# Patient Record
Sex: Female | Born: 1990 | Race: Black or African American | Hispanic: No | Marital: Single | Smoking: Current every day smoker
Health system: Southern US, Community
[De-identification: ages and names within clinical notes are randomized; demographics above are authoritative.]

## PROBLEM LIST (undated history)

## (undated) DIAGNOSIS — Z789 Other specified health status: Secondary | ICD-10-CM

---

## 2013-12-17 ENCOUNTER — Encounter (HOSPITAL_COMMUNITY): Payer: Self-pay | Admitting: Emergency Medicine

## 2013-12-17 ENCOUNTER — Emergency Department (HOSPITAL_COMMUNITY): Payer: Self-pay

## 2013-12-17 ENCOUNTER — Inpatient Hospital Stay (HOSPITAL_COMMUNITY)
Admission: EM | Admit: 2013-12-17 | Discharge: 2013-12-19 | DRG: 690 | Disposition: A | Discharge status: Home Health | Payer: Self-pay | Attending: Internal Medicine | Admitting: Internal Medicine

## 2013-12-17 DIAGNOSIS — F17200 Nicotine dependence, unspecified, uncomplicated: Secondary | ICD-10-CM | POA: Diagnosis present

## 2013-12-17 DIAGNOSIS — B9689 Other specified bacterial agents as the cause of diseases classified elsewhere: Secondary | ICD-10-CM | POA: Diagnosis present

## 2013-12-17 DIAGNOSIS — N760 Acute vaginitis: Secondary | ICD-10-CM | POA: Diagnosis present

## 2013-12-17 DIAGNOSIS — N12 Tubulo-interstitial nephritis, not specified as acute or chronic: Principal | ICD-10-CM | POA: Diagnosis present

## 2013-12-17 DIAGNOSIS — Z6841 Body Mass Index (BMI) 40.0 and over, adult: Secondary | ICD-10-CM

## 2013-12-17 DIAGNOSIS — A499 Bacterial infection, unspecified: Secondary | ICD-10-CM | POA: Diagnosis present

## 2013-12-17 HISTORY — DX: Other specified health status: Z78.9

## 2013-12-17 LAB — COMPREHENSIVE METABOLIC PANEL
ALBUMIN: 3.6 g/dL (ref 3.5–5.2)
ALT: 11 U/L (ref 0–35)
AST: 14 U/L (ref 0–37)
Alkaline Phosphatase: 90 U/L (ref 39–117)
Anion gap: 13 (ref 5–15)
BUN: 7 mg/dL (ref 6–23)
CALCIUM: 8.9 mg/dL (ref 8.4–10.5)
CO2: 22 mEq/L (ref 19–32)
Chloride: 102 mEq/L (ref 96–112)
Creatinine, Ser: 1.03 mg/dL (ref 0.50–1.10)
GFR calc non Af Amer: 77 mL/min — ABNORMAL LOW (ref >90)
GFR, EST AFRICAN AMERICAN: 89 mL/min — AB (ref >90)
Glucose, Bld: 98 mg/dL (ref 70–99)
Potassium: 3.8 mEq/L (ref 3.7–5.3)
SODIUM: 137 meq/L (ref 137–147)
TOTAL PROTEIN: 7.2 g/dL (ref 6.0–8.3)
Total Bilirubin: 0.7 mg/dL (ref 0.3–1.2)

## 2013-12-17 LAB — URINE MICROSCOPIC-ADD ON

## 2013-12-17 LAB — CBC WITH DIFFERENTIAL/PLATELET
Basophils Absolute: 0 10*3/uL (ref 0.0–0.1)
Basophils Relative: 0 % (ref 0–1)
EOS ABS: 0 10*3/uL (ref 0.0–0.7)
Eosinophils Relative: 0 % (ref 0–5)
HCT: 37.4 % (ref 36.0–46.0)
Hemoglobin: 12.3 g/dL (ref 12.0–15.0)
LYMPHS PCT: 10 % — AB (ref 12–46)
Lymphs Abs: 1.5 10*3/uL (ref 0.7–4.0)
MCH: 23 pg — ABNORMAL LOW (ref 26.0–34.0)
MCHC: 32.9 g/dL (ref 30.0–36.0)
MCV: 70 fL — ABNORMAL LOW (ref 78.0–100.0)
Monocytes Absolute: 1.6 10*3/uL — ABNORMAL HIGH (ref 0.1–1.0)
Monocytes Relative: 11 % (ref 3–12)
NEUTROS PCT: 79 % — AB (ref 43–77)
Neutro Abs: 11.7 10*3/uL — ABNORMAL HIGH (ref 1.7–7.7)
PLATELETS: 169 10*3/uL (ref 150–400)
RBC: 5.34 MIL/uL — AB (ref 3.87–5.11)
RDW: 14.2 % (ref 11.5–15.5)
WBC: 14.8 10*3/uL — ABNORMAL HIGH (ref 4.0–10.5)

## 2013-12-17 LAB — WET PREP, GENITAL
TRICH WET PREP: NONE SEEN
Yeast Wet Prep HPF POC: NONE SEEN

## 2013-12-17 LAB — LIPASE, BLOOD: Lipase: 10 U/L — ABNORMAL LOW (ref 11–59)

## 2013-12-17 LAB — URINALYSIS, ROUTINE W REFLEX MICROSCOPIC
Bilirubin Urine: NEGATIVE
Glucose, UA: NEGATIVE mg/dL
Ketones, ur: NEGATIVE mg/dL
Nitrite: NEGATIVE
PH: 7 (ref 5.0–8.0)
PROTEIN: 30 mg/dL — AB
Specific Gravity, Urine: 1.018 (ref 1.005–1.030)
Urobilinogen, UA: 1 mg/dL (ref 0.0–1.0)

## 2013-12-17 LAB — POC URINE PREG, ED: PREG TEST UR: NEGATIVE

## 2013-12-17 MED ORDER — IOHEXOL 300 MG/ML  SOLN
25.0000 mL | INTRAMUSCULAR | Status: AC
  Administered 2013-12-17: 25 mL via ORAL

## 2013-12-17 MED ORDER — DIPHENHYDRAMINE HCL 50 MG/ML IJ SOLN
50.0000 mg | Freq: Once | INTRAMUSCULAR | Status: AC
  Administered 2013-12-17: 50 mg via INTRAVENOUS
  Filled 2013-12-17: qty 1

## 2013-12-17 MED ORDER — METOCLOPRAMIDE HCL 5 MG/ML IJ SOLN
10.0000 mg | Freq: Once | INTRAMUSCULAR | Status: AC
  Administered 2013-12-17: 10 mg via INTRAVENOUS
  Filled 2013-12-17: qty 2

## 2013-12-17 MED ORDER — IOHEXOL 300 MG/ML  SOLN
100.0000 mL | Freq: Once | INTRAMUSCULAR | Status: AC | PRN
  Administered 2013-12-17: 100 mL via INTRAVENOUS

## 2013-12-17 MED ORDER — KETOROLAC TROMETHAMINE 30 MG/ML IJ SOLN
30.0000 mg | Freq: Once | INTRAMUSCULAR | Status: AC
  Administered 2013-12-17: 30 mg via INTRAVENOUS
  Filled 2013-12-17: qty 1

## 2013-12-17 NOTE — ED Provider Notes (Signed)
CSN: [id number]     Arrival date & time 12/17/13  1843 History   First MD Initiated Contact with Patient 12/17/13 2029     Chief Complaint  Patient presents with  . Abdominal Pain  . Headache     (Consider location/radiation/quality/duration/timing/severity/associated sxs/prior Treatment) HPI Cynthia Mayer is a 23 y.o. female with no significant past medical history who comes in for evaluation of abdominal pain and headache. She reports for the last 4 days she has had abdominal pain that she localizes to her waistline. She reports she's been nauseous and has vomited several times over the past 4 days, she cannot quantify how much but says "it's enough". He also had a fever at home she has tried to treat with Tylenol PM, but that only makes her sleepy and has not had a fever. She reports she is currently sexually active with her female partner, they do not use protection and most recent intercourse was Sunday after she finished her last menstrual period. She denies any vaginal discharge, no difficulty using the restroom, no dysuria no hematuria, no diarrhea no constipation. She reports having had a headache since Saturday. She localizes the pain to right behind her eyes. She attributes the pain in today herself in the forehead with a barbell she was using on her cousins weight bench. She reports closing her eyes makes the pain better. No numbness or weakness  Past Medical History  Diagnosis Date  . Medical history non-contributory    History reviewed. No pertinent past surgical history. History reviewed. No pertinent family history. History  Substance Use Topics  . Smoking status: Current Every Day Smoker -- 0.50 packs/day    Types: Cigarettes  . Smokeless tobacco: Not on file  . Alcohol Use: No   OB History   Grav Para Term Preterm Abortions TAB SAB Ect Mult Living                 Review of Systems  Constitutional: Negative for fever.  HENT: Negative for sore throat.   Eyes: Negative  for visual disturbance.  Respiratory: Negative for shortness of breath.   Cardiovascular: Negative for chest pain.  Gastrointestinal: Positive for nausea, vomiting and abdominal pain.  Endocrine: Negative for polyuria.  Genitourinary: Negative for dysuria and vaginal discharge.  Musculoskeletal: Positive for myalgias.  Skin: Negative for rash.  Neurological: Positive for headaches. Negative for numbness.      Allergies  Review of patient's allergies indicates no known allergies.  Home Medications   Prior to Admission medications   Not on File   BP 136/72  Pulse 80  Temp(Src) 102.9 F (39.4 C) (Oral)  Resp 18  Ht  (1.702 m)  Wt 259 lb 3 oz (117.567 kg)  BMI 40.59 kg/m2  SpO2 93%  LMP 12/15/2013 Physical Exam  Nursing note and vitals reviewed. Constitutional: She is oriented to person, place, and time. She appears well-developed and well-nourished. No distress.  HENT:  Head: Normocephalic and atraumatic.  Mouth/Throat: Oropharynx is clear and moist. No oropharyngeal exudate.  Eyes: Conjunctivae and EOM are normal. Right eye exhibits no discharge. Left eye exhibits no discharge. No scleral icterus.  Neck: Normal range of motion. No JVD present.  Cardiovascular: Normal rate, regular rhythm and normal heart sounds.   Pulmonary/Chest: Effort normal. No respiratory distress.  Abdominal: Soft. She exhibits no mass. There is tenderness. There is no guarding.  There is diffuse tenderness throughout the abdomen. She reports she is most tender in her right lower quadrant.  No other rashes or lesions appreciated.   Genitourinary:  Chaperone was present for the entire genital exam. No lesions or rashes appreciated on vulva. Cervix visualized on speculum exam and appropriate cultures sampled. Some blood in vaginal vault. Discharge not appreciated. Positive whiff test. Upon bi manual exam-  TTP of the adnexa with cervical motion tenderness. No fullness or masses appreciated. No  abnormalities appreciated in structural anatomy.   Musculoskeletal: Normal range of motion.  Neurological: She is alert and oriented to person, place, and time.  Skin: Skin is warm and dry. No rash noted. She is not diaphoretic.  She is warm to touch  Psychiatric: She has a normal mood and affect.    ED Course  Procedures (including critical care time) Labs Review Labs Reviewed  WET PREP, GENITAL - Abnormal; Notable for the following:    Clue Cells Wet Prep HPF POC MODERATE (*)    WBC, Wet Prep HPF POC FEW (*)    All other components within normal limits  CBC WITH DIFFERENTIAL - Abnormal; Notable for the following:    WBC 14.8 (*)    RBC 5.34 (*)    MCV 70.0 (*)    MCH 23.0 (*)    Neutrophils Relative % 79 (*)    Lymphocytes Relative 10 (*)    Neutro Abs 11.7 (*)    Monocytes Absolute 1.6 (*)    All other components within normal limits  COMPREHENSIVE METABOLIC PANEL - Abnormal; Notable for the following:    GFR calc non Af Amer 77 (*)    GFR calc Af Amer 89 (*)    All other components within normal limits  LIPASE, BLOOD - Abnormal; Notable for the following:    Lipase 10 (*)    All other components within normal limits  URINALYSIS, ROUTINE W REFLEX MICROSCOPIC - Abnormal; Notable for the following:    Color, Urine AMBER (*)    APPearance CLOUDY (*)    Hgb urine dipstick LARGE (*)    Protein, ur 30 (*)    Leukocytes, UA MODERATE (*)    All other components within normal limits  URINE MICROSCOPIC-ADD ON - Abnormal; Notable for the following:    Squamous Epithelial / LPF MANY (*)    Bacteria, UA MANY (*)    All other components within normal limits  GC/CHLAMYDIA PROBE AMP  URINE CULTURE  HIV ANTIBODY (ROUTINE TESTING)  CBC  POC URINE PREG, ED    Imaging Review US Transvaginal Non-ob  12/18/2013   CLINICAL DATA:  Abdominal pain, rule out abscess or hydrosalpinx.  EXAM: TRANSABDOMINAL AND TRANSVAGINAL ULTRASOUND OF PELVIS  TECHNIQUE: Both transabdominal and  transvaginal ultrasound examinations of the pelvis were performed. Transabdominal technique was performed for global imaging of the pelvis including uterus, ovaries, adnexal regions, and pelvic cul-de-sac. It was necessary to proceed with endovaginal exam following the transabdominal exam to visualize the endometrium and adnexa.  COMPARISON:  12/17/2013 CT  FINDINGS: Uterus  Measurements: 7.4 x 3.3 x 3.8 cm. No fibroids or other mass visualized.  Endometrium  Thickness: 9 mm.  No focal abnormality visualized.  Right ovary  Measurements: 3.8 x 3.6 x 4.4 cm. There is a involuted cyst measuring 1.7 0.2 0.1 cm. No appreciable hydrosalpinx.  Left ovary  Measurements: 4.5 x 3.0 x 2.2 cm. Normal appearance/no adnexal mass.  Other findings  Moderate to large amount of free fluid within the adnexa, superior to the uterine fundus, and within the cul-de-sac.  IMPRESSION: No adnexal mass or hydrosalpinx.  Moderate to large amount of free fluid within the pelvis.   Electronically Signed   By: Jearld Lesch M.D.   On: 12/18/2013 01:59   US Pelvis Complete  12/18/2013   CLINICAL DATA:  Abdominal pain, rule out abscess or hydrosalpinx.  EXAM: TRANSABDOMINAL AND TRANSVAGINAL ULTRASOUND OF PELVIS  TECHNIQUE: Both transabdominal and transvaginal ultrasound examinations of the pelvis were performed. Transabdominal technique was performed for global imaging of the pelvis including uterus, ovaries, adnexal regions, and pelvic cul-de-sac. It was necessary to proceed with endovaginal exam following the transabdominal exam to visualize the endometrium and adnexa.  COMPARISON:  12/17/2013 CT  FINDINGS: Uterus  Measurements: 7.4 x 3.3 x 3.8 cm. No fibroids or other mass visualized.  Endometrium  Thickness: 9 mm.  No focal abnormality visualized.  Right ovary  Measurements: 3.8 x 3.6 x 4.4 cm. There is a involuted cyst measuring 1.7 0.2 0.1 cm. No appreciable hydrosalpinx.  Left ovary  Measurements: 4.5 x 3.0 x 2.2 cm. Normal  appearance/no adnexal mass.  Other findings  Moderate to large amount of free fluid within the adnexa, superior to the uterine fundus, and within the cul-de-sac.  IMPRESSION: No adnexal mass or hydrosalpinx.  Moderate to large amount of free fluid within the pelvis.   Electronically Signed   By: Jearld Lesch M.D.   On: 12/18/2013 01:59   Ct Abdomen Pelvis W Contrast  12/17/2013   CLINICAL DATA:  Rule out appendicitis, lower abdominal pain.  EXAM: CT ABDOMEN AND PELVIS WITH CONTRAST  TECHNIQUE: Multidetector CT imaging of the abdomen and pelvis was performed using the standard protocol following bolus administration of intravenous contrast.  CONTRAST:  OMNIPAQUE IOHEXOL 300 MG/ML  SOLN  COMPARISON:  None.  FINDINGS: Lung bases clear.  No appreciable abnormality of the liver, biliary system, spleen, pancreas, adrenal glands, left kidney.  Focal areas of heterogeneous/hypoattenuation of the right kidney including medially on image 34 and inferiorly on image 45. No hydroureteronephrosis.  The proximal colon is decompressed, limiting evaluation. There is ill-defined fluid/fat stranding along the right pericolic gutter. Normal appendix. Small bowel loops are of normal course. Incidental transient intussusception left upper quadrant. No free intraperitoneal air. Reactive size lymph nodes. Normal caliber aorta and branch vessels.  Partially decompressed bladder. Normal CT appearance to the uterus. Nonspecific appearance to the right adnexa, difficult to separate from adjacent loops of bowel. Small amount of free fluid within the pelvis.  IMPRESSION: Areas of heterogeneous/hypoattenuation involving the right kidney, suspicious for pyelonephritis.  Complex appearance to the right adnexa. Recommend ultrasound to evaluate for an ovarian lesion or hydrosalpinx. Correlate clinically if concerned for pelvic inflammatory disease.  Stranding/ill-defined fluid within the right pericolic gutter presumably tracks from one  of the above processes. In addition, there is a small amount of free fluid dependently within the pelvis measuring higher than that of simple fluid.  Normal appendix.   Electronically Signed   By: Jearld Lesch M.D.   On: 12/17/2013 22:41     EKG Interpretation None     Meds given in ED:  Medications  cefTRIAXone (ROCEPHIN) 1 g in dextrose 5 % 50 mL IVPB (1 g Intravenous New Bag/Given 12/18/13 0250)  heparin injection 5,000 Units (5,000 Units Subcutaneous Given 12/18/13 0631)  0.9 %  sodium chloride infusion ( Intravenous New Bag/Given 12/18/13 0300)  acetaminophen (TYLENOL) tablet 650 mg (not administered)    Or  acetaminophen (TYLENOL) suppository 650 mg (not administered)  HYDROcodone-acetaminophen (NORCO/VICODIN) 5-325 MG per tablet 1-2 tablet (not  administered)  metroNIDAZOLE (FLAGYL) tablet 500 mg (not administered)  iohexol (OMNIPAQUE) 300 MG/ML solution 25 mL (25 mLs Oral Contrast Given 12/17/13 2139)  iohexol (OMNIPAQUE) 300 MG/ML solution 100 mL (100 mLs Intravenous Contrast Given 12/17/13 2203)  ketorolac (TORADOL) 30 MG/ML injection 30 mg (30 mg Intravenous Given 12/17/13 2350)  metoCLOPramide (REGLAN) injection 10 mg (10 mg Intravenous Given 12/17/13 2350)  diphenhydrAMINE (BENADRYL) injection 50 mg (50 mg Intravenous Given 12/17/13 2351)  cefoTEtan (CEFOTAN) 2 g in dextrose 5 % 50 mL IVPB (2 g Intravenous Given 12/18/13 0151)  doxycycline (VIBRA-TABS) tablet 100 mg (100 mg Oral Given 12/18/13 0135)    There are no discharge medications for this patient.   Filed Vitals:   12/18/13 0230 12/18/13 0252 12/18/13 0324 12/18/13 0940  BP:  132/74 121/79 136/72  Pulse:  74 86 80  Temp: 100.8 F (38.2 C)  98.5 F (36.9 C) 102.9 F (39.4 C)  TempSrc: Oral  Oral Oral  Resp:  Height:    (1.702 m)   Weight:   259 lb 3 oz (117.567 kg)   SpO2:  98% 93% 93%    MDM  Vitals stable - WNL - mildly febrile Pt resting comfortably in ED. Migraine cocktail helped with HA. PE- Diffuse  abd pain, Pelvic yielded cervical motion as well as bil. adnexal tenderness. Labwork shows leukocytosis, BV Imaging: Ct showed diffuse stranding to R kidney and adnexa,  Will treat for BV, PID and Pyelo Started Cefotetan in ED. Will continue with appropriate therapy depending on results from transvaginal US.  Transferred care to Dr. Norlene Campbell at 1:30am.   Final diagnoses:  Pyelonephritis  Prior to patient discharge, I discussed and reviewed this case with Dr.Otter         Sharlene Motts, PA-C 12/18/13 1219

## 2013-12-17 NOTE — ED Notes (Signed)
Pt reports lower abdominal pain with N/V x 4 days. PT also reports bilateral back pain with urinary frequency, dysuria. Denies hematuria. Pt also states she has had 10/10 HA x 4 days that comes and goes. Pt  In NAD. AO x4.

## 2013-12-17 NOTE — ED Notes (Signed)
Patient transported to CT 

## 2013-12-17 NOTE — ED Notes (Signed)
Oral contrast given by RT tech instructions given.

## 2013-12-18 ENCOUNTER — Encounter (HOSPITAL_COMMUNITY): Payer: Self-pay

## 2013-12-18 ENCOUNTER — Emergency Department (HOSPITAL_COMMUNITY): Payer: Self-pay

## 2013-12-18 DIAGNOSIS — B9689 Other specified bacterial agents as the cause of diseases classified elsewhere: Secondary | ICD-10-CM

## 2013-12-18 DIAGNOSIS — A499 Bacterial infection, unspecified: Secondary | ICD-10-CM

## 2013-12-18 DIAGNOSIS — F17200 Nicotine dependence, unspecified, uncomplicated: Secondary | ICD-10-CM | POA: Diagnosis present

## 2013-12-18 DIAGNOSIS — N760 Acute vaginitis: Secondary | ICD-10-CM

## 2013-12-18 DIAGNOSIS — N12 Tubulo-interstitial nephritis, not specified as acute or chronic: Principal | ICD-10-CM

## 2013-12-18 LAB — CBC
HEMATOCRIT: 33.6 % — AB (ref 36.0–46.0)
Hemoglobin: 11.1 g/dL — ABNORMAL LOW (ref 12.0–15.0)
MCH: 23.4 pg — ABNORMAL LOW (ref 26.0–34.0)
MCHC: 33 g/dL (ref 30.0–36.0)
MCV: 70.7 fL — ABNORMAL LOW (ref 78.0–100.0)
PLATELETS: 135 10*3/uL — AB (ref 150–400)
RBC: 4.75 MIL/uL (ref 3.87–5.11)
RDW: 14 % (ref 11.5–15.5)
WBC: 15.5 10*3/uL — AB (ref 4.0–10.5)

## 2013-12-18 LAB — HIV ANTIBODY (ROUTINE TESTING W REFLEX): HIV 1&2 Ab, 4th Generation: NONREACTIVE

## 2013-12-18 MED ORDER — CEFOTETAN DISODIUM 2 G IJ SOLR
2.0000 g | Freq: Once | INTRAMUSCULAR | Status: AC
  Administered 2013-12-18: 2 g via INTRAVENOUS
  Filled 2013-12-18: qty 2

## 2013-12-18 MED ORDER — METRONIDAZOLE 500 MG PO TABS
500.0000 mg | ORAL_TABLET | Freq: Two times a day (BID) | ORAL | Status: DC
  Administered 2013-12-18 – 2013-12-19 (×3): 500 mg via ORAL
  Filled 2013-12-18 (×4): qty 1

## 2013-12-18 MED ORDER — SODIUM CHLORIDE 0.9 % IV SOLN
INTRAVENOUS | Status: DC
  Administered 2013-12-18 – 2013-12-19 (×3): via INTRAVENOUS

## 2013-12-18 MED ORDER — ACETAMINOPHEN 650 MG RE SUPP: 650.0000 mg | Freq: Four times a day (QID) | RECTAL | Status: DC | PRN

## 2013-12-18 MED ORDER — DOXYCYCLINE HYCLATE 100 MG PO TABS
100.0000 mg | ORAL_TABLET | Freq: Once | ORAL | Status: AC
  Administered 2013-12-18: 100 mg via ORAL
  Filled 2013-12-18: qty 1

## 2013-12-18 MED ORDER — CEFTRIAXONE SODIUM 1 G IJ SOLR
1.0000 g | INTRAMUSCULAR | Status: DC
  Administered 2013-12-18 – 2013-12-19 (×2): 1 g via INTRAVENOUS
  Filled 2013-12-18 (×3): qty 10

## 2013-12-18 MED ORDER — HEPARIN SODIUM (PORCINE) 5000 UNIT/ML IJ SOLN
5000.0000 [IU] | Freq: Three times a day (TID) | INTRAMUSCULAR | Status: DC
  Administered 2013-12-18 – 2013-12-19 (×5): 5000 [IU] via SUBCUTANEOUS
  Filled 2013-12-18 (×6): qty 1

## 2013-12-18 MED ORDER — ACETAMINOPHEN 325 MG PO TABS
650.0000 mg | ORAL_TABLET | Freq: Four times a day (QID) | ORAL | Status: DC | PRN
  Administered 2013-12-18 – 2013-12-19 (×3): 650 mg via ORAL
  Filled 2013-12-18 (×3): qty 2

## 2013-12-18 MED ORDER — HYDROCODONE-ACETAMINOPHEN 5-325 MG PO TABS
1.0000 | ORAL_TABLET | ORAL | Status: DC | PRN
  Filled 2013-12-18: qty 2

## 2013-12-18 NOTE — H&P (Signed)
[facility] History and Physical  Cynthia Mayer WUJ:[id number] DOB: 03-28-1991 DOA: 12/17/2013  Referring physician: EDP PCP: No PCP Per Patient   Chief Complaint: Abdominal pain, headache   HPI: Cynthia Mayer is a 23 y.o. female who presents to the ED with abdominal pain, headache, N/V.  Symptoms onset 4 days ago and have been worsening.  She denies fever subjectively (objective T 100.8 in ED).  She was treated empirically for PID in the ED while GC is pending, work up in ED has revealed pyelonephritis.  Review of Systems: Systems reviewed.  As above, otherwise negative  History reviewed. No pertinent past medical history. History reviewed. No pertinent past surgical history. Social History:  reports that she has been smoking Cigarettes.  She has been smoking about 0.50 packs per day. She does not have any smokeless tobacco history on file. She reports that she does not drink alcohol or use illicit drugs.  No Known Allergies  No family history on file.   Prior to Admission medications   Not on File   Physical Exam: Filed Vitals:   12/18/13 0230  BP:   Pulse:   Temp: 100.8 F (38.2 C)  Resp:     BP 132/68  Pulse 84  Temp(Src) 100.8 F (38.2 C) (Oral)  Resp 16  Ht  (1.702 m)  Wt 114.306 kg (252 lb)  BMI 39.46 kg/m2  SpO2 99%  LMP 12/15/2013  General Appearance:    Alert, oriented, no distress, appears stated age  Head:    Normocephalic, atraumatic  Eyes:    PERRL, EOMI, sclera non-icteric        Nose:   Nares without drainage or epistaxis. Mucosa, turbinates normal  Throat:   Moist mucous membranes. Oropharynx without erythema or exudate.  Neck:   Supple. No carotid bruits.  No thyromegaly.  No lymphadenopathy.   Back:     No CVA tenderness, no spinal tenderness  Lungs:     Clear to auscultation bilaterally, without wheezes, rhonchi or rales  Chest wall:    No tenderness to palpitation  Heart:    Regular rate and rhythm without murmurs, gallops, rubs   Abdomen:     Soft, non-tender, nondistended, normal bowel sounds, no organomegaly  Genitalia:    deferred  Rectal:    deferred  Extremities:   No clubbing, cyanosis or edema.  Pulses:   2+ and symmetric all extremities  Skin:   Skin color, texture, turgor normal, no rashes or lesions  Lymph nodes:   Cervical, supraclavicular, and axillary nodes normal  Neurologic:   CNII-XII intact. Normal strength, sensation and reflexes      throughout    Labs on Admission:  Basic Metabolic Panel:  Recent Labs Lab 12/17/13 1932  NA 137  K 3.8  CL 102  CO2 22  GLUCOSE 98  BUN 7  CREATININE 1.03  CALCIUM 8.9   Liver Function Tests:  Recent Labs Lab 12/17/13 1932  AST 14  ALT 11  ALKPHOS 90  BILITOT 0.7  PROT 7.2  ALBUMIN 3.6    Recent Labs Lab 12/17/13 1932  LIPASE 10*   No results found for this basename: AMMONIA,  in the last 168 hours CBC:  Recent Labs Lab 12/17/13 1932  WBC 14.8*  NEUTROABS 11.7*  HGB 12.3  HCT 37.4  MCV 70.0*  PLT 169   Cardiac Enzymes: No results found for this basename: CKTOTAL, CKMB, CKMBINDEX, TROPONINI,  in the last 168 hours  BNP (last 3 results) No  results found for this basename: PROBNP,  in the last 8760 hours CBG: No results found for this basename: GLUCAP,  in the last 168 hours  Radiological Exams on Admission: US Transvaginal Non-ob  12/18/2013   CLINICAL DATA:  Abdominal pain, rule out abscess or hydrosalpinx.  EXAM: TRANSABDOMINAL AND TRANSVAGINAL ULTRASOUND OF PELVIS  TECHNIQUE: Both transabdominal and transvaginal ultrasound examinations of the pelvis were performed. Transabdominal technique was performed for global imaging of the pelvis including uterus, ovaries, adnexal regions, and pelvic cul-de-sac. It was necessary to proceed with endovaginal exam following the transabdominal exam to visualize the endometrium and adnexa.  COMPARISON:  12/17/2013 CT  FINDINGS: Uterus  Measurements: 7.4 x 3.3 x 3.8 cm. No fibroids or other  mass visualized.  Endometrium  Thickness: 9 mm.  No focal abnormality visualized.  Right ovary  Measurements: 3.8 x 3.6 x 4.4 cm. There is a involuted cyst measuring 1.7 0.2 0.1 cm. No appreciable hydrosalpinx.  Left ovary  Measurements: 4.5 x 3.0 x 2.2 cm. Normal appearance/no adnexal mass.  Other findings  Moderate to large amount of free fluid within the adnexa, superior to the uterine fundus, and within the cul-de-sac.  IMPRESSION: No adnexal mass or hydrosalpinx.  Moderate to large amount of free fluid within the pelvis.   Electronically Signed   By: Jearld Lesch M.D.   On: 12/18/2013 01:59   US Pelvis Complete  12/18/2013   CLINICAL DATA:  Abdominal pain, rule out abscess or hydrosalpinx.  EXAM: TRANSABDOMINAL AND TRANSVAGINAL ULTRASOUND OF PELVIS  TECHNIQUE: Both transabdominal and transvaginal ultrasound examinations of the pelvis were performed. Transabdominal technique was performed for global imaging of the pelvis including uterus, ovaries, adnexal regions, and pelvic cul-de-sac. It was necessary to proceed with endovaginal exam following the transabdominal exam to visualize the endometrium and adnexa.  COMPARISON:  12/17/2013 CT  FINDINGS: Uterus  Measurements: 7.4 x 3.3 x 3.8 cm. No fibroids or other mass visualized.  Endometrium  Thickness: 9 mm.  No focal abnormality visualized.  Right ovary  Measurements: 3.8 x 3.6 x 4.4 cm. There is a involuted cyst measuring 1.7 0.2 0.1 cm. No appreciable hydrosalpinx.  Left ovary  Measurements: 4.5 x 3.0 x 2.2 cm. Normal appearance/no adnexal mass.  Other findings  Moderate to large amount of free fluid within the adnexa, superior to the uterine fundus, and within the cul-de-sac.  IMPRESSION: No adnexal mass or hydrosalpinx.  Moderate to large amount of free fluid within the pelvis.   Electronically Signed   By: Jearld Lesch M.D.   On: 12/18/2013 01:59   Ct Abdomen Pelvis W Contrast  12/17/2013   CLINICAL DATA:  Rule out appendicitis, lower abdominal  pain.  EXAM: CT ABDOMEN AND PELVIS WITH CONTRAST  TECHNIQUE: Multidetector CT imaging of the abdomen and pelvis was performed using the standard protocol following bolus administration of intravenous contrast.  CONTRAST:  OMNIPAQUE IOHEXOL 300 MG/ML  SOLN  COMPARISON:  None.  FINDINGS: Lung bases clear.  No appreciable abnormality of the liver, biliary system, spleen, pancreas, adrenal glands, left kidney.  Focal areas of heterogeneous/hypoattenuation of the right kidney including medially on image 34 and inferiorly on image 45. No hydroureteronephrosis.  The proximal colon is decompressed, limiting evaluation. There is ill-defined fluid/fat stranding along the right pericolic gutter. Normal appendix. Small bowel loops are of normal course. Incidental transient intussusception left upper quadrant. No free intraperitoneal air. Reactive size lymph nodes. Normal caliber aorta and branch vessels.  Partially decompressed bladder. Normal CT appearance  to the uterus. Nonspecific appearance to the right adnexa, difficult to separate from adjacent loops of bowel. Small amount of free fluid within the pelvis.  IMPRESSION: Areas of heterogeneous/hypoattenuation involving the right kidney, suspicious for pyelonephritis.  Complex appearance to the right adnexa. Recommend ultrasound to evaluate for an ovarian lesion or hydrosalpinx. Correlate clinically if concerned for pelvic inflammatory disease.  Stranding/ill-defined fluid within the right pericolic gutter presumably tracks from one of the above processes. In addition, there is a small amount of free fluid dependently within the pelvis measuring higher than that of simple fluid.  Normal appendix.   Electronically Signed   By: Jearld Lesch M.D.   On: 12/17/2013 22:41    EKG: Independently reviewed.  Assessment/Plan Principal Problem:   Pyelonephritis   1. Pyelonephritis - Rocephin daily, urine cultures pending, IVF, norco and tylenol for pain and  fever.   Code Status: Full Code  Family Communication: Family at bedside Disposition Plan: Admit to obs   Time spent: 50 min  GARDNER, JARED M. [facility] Pager [phone]  If 7AM-7PM, please contact the day team taking care of the patient [email or web address] Password [facility] 12/18/2013, 2:47 AM

## 2013-12-18 NOTE — ED Provider Notes (Signed)
Medical screening examination/treatment/procedure(s) were conducted as a shared visit with non-physician practitioner(s) and myself.  I personally evaluated the patient during the encounter.   EKG Interpretation None      Right sided/lower abd pain with n/v, fever for 4 days.  CT scan concerning for pyelonephritis, also possible hydrosalpinx.  Pelvic exam with some right sided adnexal tenderness, CMT.  GC/Chlamydia pending.  Will start abx for PID-doxy, cefotetan.  Pt feeling better after medications.   U/s without signs of tubo-ovarian abscess or hydrosalpinx, resolving cyst.  Will d/w hospitalist for admission for pyelonephritis.  Results for orders placed during the hospital encounter of 12/17/13  WET PREP, GENITAL      Result Value Ref Range   Yeast Wet Prep HPF POC NONE SEEN  NONE SEEN   Trich, Wet Prep NONE SEEN  NONE SEEN   Clue Cells Wet Prep HPF POC MODERATE (*) NONE SEEN   WBC, Wet Prep HPF POC FEW (*) NONE SEEN  CBC WITH DIFFERENTIAL      Result Value Ref Range   WBC 14.8 (*) 4.0 - 10.5 K/uL   RBC 5.34 (*) 3.87 - 5.11 MIL/uL   Hemoglobin 12.3  12.0 - 15.0 g/dL   HCT 16.1  09.6 - 04.5 %   MCV 70.0 (*) 78.0 - 100.0 fL   MCH 23.0 (*) 26.0 - 34.0 pg   MCHC 32.9  30.0 - 36.0 g/dL   RDW 40.9  81.1 - 91.4 %   Platelets 169  150 - 400 K/uL   Neutrophils Relative % 79 (*) 43 - 77 %   Lymphocytes Relative 10 (*) 12 - 46 %   Monocytes Relative 11  3 - 12 %   Eosinophils Relative 0  0 - 5 %   Basophils Relative 0  0 - 1 %   Neutro Abs 11.7 (*) 1.7 - 7.7 K/uL   Lymphs Abs 1.5  0.7 - 4.0 K/uL   Monocytes Absolute 1.6 (*) 0.1 - 1.0 K/uL   Eosinophils Absolute 0.0  0.0 - 0.7 K/uL   Basophils Absolute 0.0  0.0 - 0.1 K/uL   WBC Morphology ATYPICAL LYMPHOCYTES    COMPREHENSIVE METABOLIC PANEL      Result Value Ref Range   Sodium 137  137 - 147 mEq/L   Potassium 3.8  3.7 - 5.3 mEq/L   Chloride 102  96 - 112 mEq/L   CO2 22  19 - 32 mEq/L   Glucose, Bld 98  70 - 99 mg/dL   BUN 7   6 - 23 mg/dL   Creatinine, Ser 7.82  0.50 - 1.10 mg/dL   Calcium 8.9  8.4 - 95.6 mg/dL   Total Protein 7.2  6.0 - 8.3 g/dL   Albumin 3.6  3.5 - 5.2 g/dL   AST 14  0 - 37 U/L   ALT 11  0 - 35 U/L   Alkaline Phosphatase 90  39 - 117 U/L   Total Bilirubin 0.7  0.3 - 1.2 mg/dL   GFR calc non Af Amer 77 (*) >90 mL/min   GFR calc Af Amer 89 (*) >90 mL/min   Anion gap 13  5 - 15  LIPASE, BLOOD      Result Value Ref Range   Lipase 10 (*) 11 - 59 U/L  URINALYSIS, ROUTINE W REFLEX MICROSCOPIC      Result Value Ref Range   Color, Urine AMBER (*) YELLOW   APPearance CLOUDY (*) CLEAR   Specific Gravity, Urine 1.018  1.005 - 1.030   pH 7.0  5.0 - 8.0   Glucose, UA NEGATIVE  NEGATIVE mg/dL   Hgb urine dipstick LARGE (*) NEGATIVE   Bilirubin Urine NEGATIVE  NEGATIVE   Ketones, ur NEGATIVE  NEGATIVE mg/dL   Protein, ur 30 (*) NEGATIVE mg/dL   Urobilinogen, UA 1.0  0.0 - 1.0 mg/dL   Nitrite NEGATIVE  NEGATIVE   Leukocytes, UA MODERATE (*) NEGATIVE  URINE MICROSCOPIC-ADD ON      Result Value Ref Range   Squamous Epithelial / LPF MANY (*) RARE   WBC, UA 7-10  <3 WBC/hpf   RBC / HPF 21-50  <3 RBC/hpf   Bacteria, UA MANY (*) RARE   Urine-Other MUCOUS PRESENT    POC URINE PREG, ED      Result Value Ref Range   Preg Test, Ur NEGATIVE  NEGATIVE   US Transvaginal Non-ob  12/18/2013   CLINICAL DATA:  Abdominal pain, rule out abscess or hydrosalpinx.  EXAM: TRANSABDOMINAL AND TRANSVAGINAL ULTRASOUND OF PELVIS  TECHNIQUE: Both transabdominal and transvaginal ultrasound examinations of the pelvis were performed. Transabdominal technique was performed for global imaging of the pelvis including uterus, ovaries, adnexal regions, and pelvic cul-de-sac. It was necessary to proceed with endovaginal exam following the transabdominal exam to visualize the endometrium and adnexa.  COMPARISON:  12/17/2013 CT  FINDINGS: Uterus  Measurements: 7.4 x 3.3 x 3.8 cm. No fibroids or other mass visualized.  Endometrium   Thickness: 9 mm.  No focal abnormality visualized.  Right ovary  Measurements: 3.8 x 3.6 x 4.4 cm. There is a involuted cyst measuring 1.7 0.2 0.1 cm. No appreciable hydrosalpinx.  Left ovary  Measurements: 4.5 x 3.0 x 2.2 cm. Normal appearance/no adnexal mass.  Other findings  Moderate to large amount of free fluid within the adnexa, superior to the uterine fundus, and within the cul-de-sac.  IMPRESSION: No adnexal mass or hydrosalpinx.  Moderate to large amount of free fluid within the pelvis.   Electronically Signed   By: Jearld Lesch M.D.   On: 12/18/2013 01:59   US Pelvis Complete  12/18/2013   CLINICAL DATA:  Abdominal pain, rule out abscess or hydrosalpinx.  EXAM: TRANSABDOMINAL AND TRANSVAGINAL ULTRASOUND OF PELVIS  TECHNIQUE: Both transabdominal and transvaginal ultrasound examinations of the pelvis were performed. Transabdominal technique was performed for global imaging of the pelvis including uterus, ovaries, adnexal regions, and pelvic cul-de-sac. It was necessary to proceed with endovaginal exam following the transabdominal exam to visualize the endometrium and adnexa.  COMPARISON:  12/17/2013 CT  FINDINGS: Uterus  Measurements: 7.4 x 3.3 x 3.8 cm. No fibroids or other mass visualized.  Endometrium  Thickness: 9 mm.  No focal abnormality visualized.  Right ovary  Measurements: 3.8 x 3.6 x 4.4 cm. There is a involuted cyst measuring 1.7 0.2 0.1 cm. No appreciable hydrosalpinx.  Left ovary  Measurements: 4.5 x 3.0 x 2.2 cm. Normal appearance/no adnexal mass.  Other findings  Moderate to large amount of free fluid within the adnexa, superior to the uterine fundus, and within the cul-de-sac.  IMPRESSION: No adnexal mass or hydrosalpinx.  Moderate to large amount of free fluid within the pelvis.   Electronically Signed   By: Jearld Lesch M.D.   On: 12/18/2013 01:59   Ct Abdomen Pelvis W Contrast  12/17/2013   CLINICAL DATA:  Rule out appendicitis, lower abdominal pain.  EXAM: CT ABDOMEN AND  PELVIS WITH CONTRAST  TECHNIQUE: Multidetector CT imaging of the abdomen and pelvis was performed  using the standard protocol following bolus administration of intravenous contrast.  CONTRAST:  OMNIPAQUE IOHEXOL 300 MG/ML  SOLN  COMPARISON:  None.  FINDINGS: Lung bases clear.  No appreciable abnormality of the liver, biliary system, spleen, pancreas, adrenal glands, left kidney.  Focal areas of heterogeneous/hypoattenuation of the right kidney including medially on image 34 and inferiorly on image 45. No hydroureteronephrosis.  The proximal colon is decompressed, limiting evaluation. There is ill-defined fluid/fat stranding along the right pericolic gutter. Normal appendix. Small bowel loops are of normal course. Incidental transient intussusception left upper quadrant. No free intraperitoneal air. Reactive size lymph nodes. Normal caliber aorta and branch vessels.  Partially decompressed bladder. Normal CT appearance to the uterus. Nonspecific appearance to the right adnexa, difficult to separate from adjacent loops of bowel. Small amount of free fluid within the pelvis.  IMPRESSION: Areas of heterogeneous/hypoattenuation involving the right kidney, suspicious for pyelonephritis.  Complex appearance to the right adnexa. Recommend ultrasound to evaluate for an ovarian lesion or hydrosalpinx. Correlate clinically if concerned for pelvic inflammatory disease.  Stranding/ill-defined fluid within the right pericolic gutter presumably tracks from one of the above processes. In addition, there is a small amount of free fluid dependently within the pelvis measuring higher than that of simple fluid.  Normal appendix.   Electronically Signed   By: Jearld Lesch M.D.   On: 12/17/2013 22:41      Olivia Mackie, MD 12/18/13 [phone]

## 2013-12-18 NOTE — Progress Notes (Signed)
UR Completed.  Matayah Reyburn Jane [phone] 12/18/2013  

## 2013-12-18 NOTE — ED Notes (Signed)
Report given to [facility] unit nurse , denies pain at this time , respirations unlabored , IV site intact.

## 2013-12-18 NOTE — Progress Notes (Signed)
Chart and note reviewed. Agree with note.  Ladarrell Cornwall, RD, LDN, CNSC Pager [phone] After Hours Pager [phone]   

## 2013-12-18 NOTE — ED Notes (Signed)
Patient transported to Ultrasound 

## 2013-12-18 NOTE — Progress Notes (Signed)
Nutrition Brief Note  Patient identified on the Malnutrition Screening Tool (MST) Report  Wt Readings from Last 15 Encounters:  12/18/13 259 lb 3 oz (117.567 kg)   Usual body weight: 252  lbs  Body mass index is 40.59 kg/(m^2). Patient meets criteria for morbid obesity based on current BMI.   Current diet order is regular as she was previously NPO. Pt reports she is hungry and has a good appetite. Labs and medications reviewed.   No nutrition interventions warranted at this time. If nutrition issues arise, please consult RD.   Marijean Niemann, MS, Provisional LDN Pager # [phone] After hours/ weekend pager # [phone]

## 2013-12-18 NOTE — ED Notes (Signed)
Dr. Gardner at bedside 

## 2013-12-18 NOTE — Progress Notes (Signed)
     Follow Up Note  Pt admitted earlier this morning.  Seen after arrived to floor.  Feeling better, less pain. Tired.  Exam: CV: Regular rate and rhythm Lungs: Clear to auscultation bilaterally Abd: Soft, obese, nontender, positive bowel sounds Ext: No clubbing cyanosis or edema  Present on Admission:  . Pyelonephritis: On IV Rocephin, checking followup white blood cell count. Awaiting urine cultures.  . Morbid obesity: Patient meets criteria with BMI greater than 40.  . Tobacco use disorder: Patient counseled to quit  . BV (bacterial vaginosis): Clue cells seen on wet prep. Started on by mouth Flagyl

## 2013-12-19 LAB — CBC
HCT: 30.3 % — ABNORMAL LOW (ref 36.0–46.0)
Hemoglobin: 10.1 g/dL — ABNORMAL LOW (ref 12.0–15.0)
MCH: 22.9 pg — AB (ref 26.0–34.0)
MCHC: 33.3 g/dL (ref 30.0–36.0)
MCV: 68.6 fL — ABNORMAL LOW (ref 78.0–100.0)
Platelets: 146 10*3/uL — ABNORMAL LOW (ref 150–400)
RBC: 4.42 MIL/uL (ref 3.87–5.11)
RDW: 13.8 % (ref 11.5–15.5)
WBC: 13.7 10*3/uL — AB (ref 4.0–10.5)

## 2013-12-19 LAB — GC/CHLAMYDIA PROBE AMP
CT PROBE, AMP APTIMA: POSITIVE — AB
GC PROBE AMP APTIMA: NEGATIVE

## 2013-12-19 MED ORDER — SODIUM CHLORIDE 0.9 % IJ SOLN: 10.0000 mL | INTRAMUSCULAR | Status: DC | PRN

## 2013-12-19 MED ORDER — NICOTINE 21 MG/24HR TD PT24
21.0000 mg | MEDICATED_PATCH | Freq: Every day | TRANSDERMAL | Status: DC
  Administered 2013-12-19: 21 mg via TRANSDERMAL
  Filled 2013-12-19: qty 1

## 2013-12-19 MED ORDER — DEXTROSE 5 % IV SOLN: 1.0000 g | INTRAVENOUS | Status: AC

## 2013-12-19 MED ORDER — METRONIDAZOLE 500 MG PO TABS: 500.0000 mg | ORAL_TABLET | Freq: Two times a day (BID) | ORAL | Status: AC

## 2013-12-19 NOTE — Discharge Summary (Signed)
Discharge Summary  Cynthia Mayer EPP:[id number] DOB: 1990-12-18  PCP: No PCP Per Patient  Admit date: 12/17/2013 Discharge date: 12/19/2013  Time spent: 35 minutes  Recommendations for Outpatient Follow-up:  1. Patient going home with home health who will continue her IV Rocephin 1 g daily. We'll also check a repeat CBC with results called to myself on 9/5. 2. New medication: Flagyl 500 mg by mouth twice a day x6 days  3. New medication: Once urine cultures returned and white blood cell count normalized, Rocephin will be discontinued and patient will be changed over to by mouth antibiotic-possibly Cipro  Discharge Diagnoses:  Active Hospital Problems   Diagnosis Date Noted  . Pyelonephritis 12/18/2013  . Morbid obesity 12/18/2013  . Tobacco use disorder 12/18/2013  . BV (bacterial vaginosis) 12/18/2013    Resolved Hospital Problems   Diagnosis Date Noted Date Resolved  No resolved problems to display.    Discharge Condition: Improved, being discharged home  Diet recommendation: Regular  Filed Weights   12/17/13 1906 12/18/13 0324 12/18/13 2201  Weight: 114.306 kg (252 lb) 117.567 kg (259 lb 3 oz) 113.6 kg (250 lb 7.1 oz)    History of present illness:  23 year old female with no past medical history other than tobacco abuse and obesity presented to emergency room with complaints of abdominal pain and dysuria. Found to have large urinary tract infection plus renal inflammation consistent with pyelonephritis. Patient underwent transvaginal/pelvic ultrasound noting large amount of pelvic fluid. Wet prep exam also noted moderate amount of clue cells  Hospital Course:  Principal Problem:   Pyelonephritis: Patient started on IV Rocephin. Urine cultures preliminary back for Escherichia coli-sensitivities pending. By 9/3, patient feeling much better. Back pain almost fully resolved. White blood cell count improving. Given the antibiotics only needed to be given once a day and patient  otherwise well, she is amenable to having a midline placed and go home with home health coordinating IV antibiotics. Plan will be for them to check a followup CBC on Saturday 9/5 with results called in to myself. Once patient's white blood cell count normalizes and urine cultures back, will change patient over to by mouth antibiotics to complete a total of 14 days of therapy Active Problems:   Morbid obesity: Patient met criteria with BMI greater than 40    Tobacco use disorder: Counseled. Patient declined nicotine patch.    BV (bacterial vaginosis): Started on by mouth Flagyl for the next 7 days twice a day given clue cells seen on wet prep exam. Patient counseled. Advised her that her partner should be treated as well. She states that she will let him know and that he will let his doctor know.   Procedures:  Placement of midline done 9/3  Consultations:  None  Discharge Exam: BP 142/79  Pulse 75  Temp(Src) 99 F (37.2 C) (Oral)  Resp 18  Ht _0  (1.702 m)  Wt 113.6 kg (250 lb 7.1 oz)  BMI 39.22 kg/m2  SpO2 100%  LMP 12/15/2013  General: Alert and oriented x3, no acute distress Cardiovascular: Regular rate and rhythm, S1-S2 Respiratory: Clear to auscultation bilaterally  Discharge Instructions You were cared for by a hospitalist during your hospital stay. If you have any questions about your discharge medications or the care you received while you were in the hospital after you are discharged, you can call the unit and asked to speak with the hospitalist on call if the hospitalist that took care of you is not available. Once you  are discharged, your primary care physician will handle any further medical issues. Please note that NO REFILLS for any discharge medications will be authorized once you are discharged, as it is imperative that you return to your primary care physician (or establish a relationship with a primary care physician if you do not have one) for your aftercare  needs so that they can reassess your need for medications and monitor your lab values.     Medication List         cefTRIAXone 1 g in dextrose 5 % 50 mL  Inject 1 g into the vein daily.     metroNIDAZOLE 500 MG tablet  Commonly known as:  FLAGYL  Take 1 tablet (500 mg total) by mouth every 12 (twelve) hours.       No Known Allergies    The results of significant diagnostics from this hospitalization (including imaging, microbiology, ancillary and laboratory) are listed below for reference.    Significant Diagnostic Studies: US Transvaginal Non-ob US Pelvis Complete  12/18/2013     IMPRESSION: No adnexal mass or hydrosalpinx.  Moderate to large amount of free fluid within the pelvis.   Electronically Signed   By: Carlos Levering M.D.   On: 12/18/2013 01:59   Ct Abdomen Pelvis W Contrast  12/17/2013     IMPRESSION: Areas of heterogeneous/hypoattenuation involving the right kidney, suspicious for pyelonephritis.  Complex appearance to the right adnexa. Recommend ultrasound to evaluate for an ovarian lesion or hydrosalpinx. Correlate clinically if concerned for pelvic inflammatory disease.  Stranding/ill-defined fluid within the right pericolic gutter presumably tracks from one of the above processes. In addition, there is a small amount of free fluid dependently within the pelvis measuring higher than that of simple fluid.  Normal appendix.   Electronically Signed   By: Carlos Levering M.D.   On: 12/17/2013 22:41    Microbiology: Recent Results (from the past 240 hour(s))  URINE CULTURE     Status: None   Collection Time    12/17/13  7:31 PM      Result Value Ref Range Status   Specimen Description URINE, CLEAN CATCH   Final   Special Requests CX ADDED AT [id number] AT 0409   Final   Culture  Setup Time     Final   Value: 12/18/2013 04:39     Performed at [place]     Final   Value: >=100,000 COLONIES/ML     Performed at [facility]   Culture      Final   Value: ESCHERICHIA COLI     Performed at [facility]   Report Status PENDING   Incomplete  WET PREP, GENITAL     Status: Abnormal   Collection Time    12/17/13 11:39 PM      Result Value Ref Range Status   Yeast Wet Prep HPF POC NONE SEEN  NONE SEEN Final   Trich, Wet Prep NONE SEEN  NONE SEEN Final   Clue Cells Wet Prep HPF POC MODERATE (*) NONE SEEN Final   WBC, Wet Prep HPF POC FEW (*) NONE SEEN Final  GC/CHLAMYDIA PROBE AMP     Status: Abnormal   Collection Time    12/17/13 11:39 PM      Result Value Ref Range Status   CT Probe RNA POSITIVE (*) NEGATIVE Final   Comment: (NOTE)     A Positive CT or NG Nucleic Acid Amplification Test (NAAT) result  should be considered presumptive evidence of infection.  The result     should be evaluated along with physical examination and other     diagnostic findings.   GC Probe RNA NEGATIVE  NEGATIVE Final   Comment: (NOTE)                                                                                               **Normal Reference Range: Negative**          Assay performed using the Gen-Probe APTIMA COMBO2 (R) Assay.     Acceptable specimen types for this assay include APTIMA Swabs (Unisex,     endocervical, urethral, or vaginal), first void urine, and ThinPrep     liquid based cytology samples.     Performed at [facility]: Basic Metabolic Panel:  Recent Labs Lab 12/17/13 1932  NA 137  K 3.8  CL 102  CO2 22  GLUCOSE 98  BUN 7  CREATININE 1.03  CALCIUM 8.9   Liver Function Tests:  Recent Labs Lab 12/17/13 1932  AST 14  ALT 11  ALKPHOS 90  BILITOT 0.7  PROT 7.2  ALBUMIN 3.6    Recent Labs Lab 12/17/13 1932  LIPASE 10*   No results found for this basename: AMMONIA,  in the last 168 hours CBC:  Recent Labs Lab 12/17/13 1932 12/18/13 1214 12/19/13 1156  WBC 14.8* 15.5* 13.7*  NEUTROABS 11.7*  --   --   HGB 12.3 11.1* 10.1*  HCT 37.4 33.6* 30.3*  MCV 70.0*  70.7* 68.6*  PLT 169 135* 146*     Signed:  Waldon Sheerin K  [facility] 12/19/2013, 3:29 PM

## 2013-12-19 NOTE — Progress Notes (Signed)
Peripherally Inserted Central Catheter/Midline Placement  The IV Nurse has discussed with the patient and/or persons authorized to consent for the patient, the purpose of this procedure and the potential benefits and risks involved with this procedure.  The benefits include less needle sticks, lab draws from the catheter and patient may be discharged home with the catheter.  Risks include, but not limited to, infection, bleeding, blood clot (thrombus formation), and puncture of an artery; nerve damage and irregular heat beat.  Alternatives to this procedure were also discussed.  PICC/Midline Placement Documentation        Stacie Glaze Horton 12/19/2013, 1:49 PM

## 2013-12-19 NOTE — Discharge Instructions (Signed)
Pyelonephritis, Adult °Pyelonephritis is a kidney infection. In general, there are 2 main types of pyelonephritis: °· Infections that come on quickly without any warning (acute pyelonephritis). °· Infections that persist for a long period of time (chronic pyelonephritis). °CAUSES  °Two main causes of pyelonephritis are: °· Bacteria traveling from the bladder to the kidney. This is a problem especially in pregnant women. The urine in the bladder can become filled with bacteria from multiple causes, including: °¨ Inflammation of the prostate gland (prostatitis). °¨ Sexual intercourse in females. °¨ Bladder infection (cystitis). °· Bacteria traveling from the bloodstream to the tissue part of the kidney. °Problems that may increase your risk of getting a kidney infection include: °· Diabetes. °· Kidney stones or bladder stones. °· Cancer. °· Catheters placed in the bladder. °· Other abnormalities of the kidney or ureter. °SYMPTOMS  °· Abdominal pain. °· Pain in the side or flank area. °· Fever. °· Chills. °· Upset stomach. °· Blood in the urine (dark urine). °· Frequent urination. °· Strong or persistent urge to urinate. °· Burning or stinging when urinating. °DIAGNOSIS  °Your caregiver may diagnose your kidney infection based on your symptoms. A urine sample may also be taken. °TREATMENT  °In general, treatment depends on how severe the infection is.  °· If the infection is mild and caught early, your caregiver may treat you with oral antibiotics and send you home. °· If the infection is more severe, the bacteria may have gotten into the bloodstream. This will require intravenous (IV) antibiotics and a hospital stay. Symptoms may include: °¨ High fever. °¨ Severe flank pain. °¨ Shaking chills. °· Even after a hospital stay, your caregiver may require you to be on oral antibiotics for a period of time. °· Other treatments may be required depending upon the cause of the infection. °HOME CARE INSTRUCTIONS  °· Take your  antibiotics as directed. Finish them even if you start to feel better. °· Make an appointment to have your urine checked to make sure the infection is gone. °· Drink enough fluids to keep your urine clear or pale yellow. °· Take medicines for the bladder if you have urgency and frequency of urination as directed by your caregiver. °SEEK IMMEDIATE MEDICAL CARE IF:  °· You have a fever or persistent symptoms for more than 2-3 days. °· You have a fever and your symptoms suddenly get worse. °· You are unable to take your antibiotics or fluids. °· You develop shaking chills. °· You experience extreme weakness or fainting. °· There is no improvement after 2 days of treatment. °MAKE SURE YOU: °· Understand these instructions. °· Will watch your condition. °· Will get help right away if you are not doing well or get worse. °Document Released: 04/04/2005 Document Revised: 10/04/2011 Document Reviewed: 09/08/2010 °ExitCare® Patient Information ©2015 [employer]. This information is not intended to replace advice given to you by your health care provider. Make sure you discuss any questions you have with your health care provider. ° °

## 2013-12-20 ENCOUNTER — Telehealth: Payer: Self-pay | Admitting: General Practice

## 2013-12-20 LAB — URINE CULTURE

## 2013-12-20 NOTE — Telephone Encounter (Signed)
Attempted to call patient to notify that walk in is schedule appt. Was Wednesday...Marland KitchenMarland Kitchen

## 2013-12-24 NOTE — Progress Notes (Signed)
Followup note:  Patient discharge last week with midline and IV antibiotics. Plan was for home health to assist with followup CBC to be done 9/5. Since 9/5, home health is been unable to find or speak to patient. They called me today to update me. I was able to find record of patient's sister, Mrs. Brooke Dare, and I have told her that we need to speak the patient in regards to her IV line as well as further antibiotics. I told her that is quite serious and the patient does not followup, she runs the risk of being hospitalized again. Patient's sister given my pager number and told to find the patient and have her get in touch with me as soon as possible.

## 2014-02-02 NOTE — ED Provider Notes (Signed)
Medical screening examination/treatment/procedure(s) were conducted as a shared visit with non-physician practitioner(s) and myself.  I personally evaluated the patient during the encounter.   EKG Interpretation None     Please see my note from this date  Olivia Mackielga M Lorana Maffeo, MD 02/02/14 [phone]

## 2015-11-14 IMAGING — US US PELVIS COMPLETE
1 series · 14 of 25 positions shown · non-contrast
Comparison: 12/17/2013 CT

CLINICAL DATA: Abdominal pain, rule out abscess or hydrosalpinx.

EXAM:
TRANSABDOMINAL AND TRANSVAGINAL ULTRASOUND OF PELVIS
TECHNIQUE: Both transabdominal and transvaginal ultrasound examinations of the
pelvis were performed. Transabdominal technique was performed for
global imaging of the pelvis including uterus, ovaries, adnexal
regions, and pelvic cul-de-sac. It was necessary to proceed with
endovaginal exam following the transabdominal exam to visualize the
endometrium and adnexa.

[Series 1: us pelvis complete · 0.18mm/px · 14 of 60 slices shown]
[im 1/60]
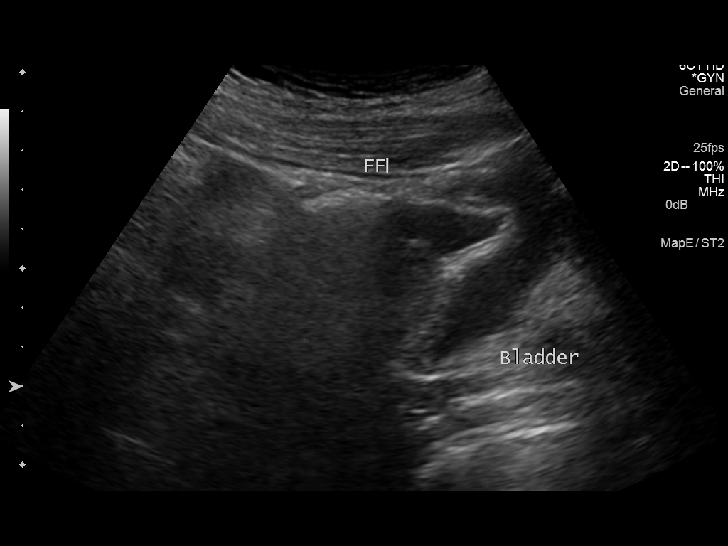
[im 5/60]
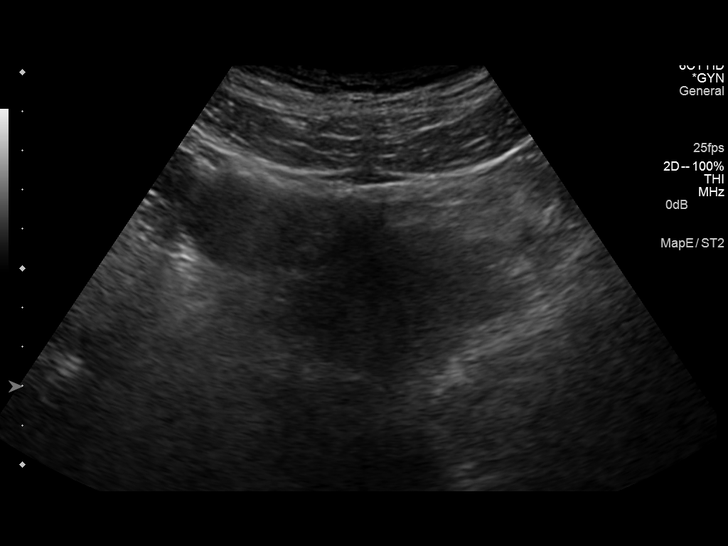
[im 10/60]
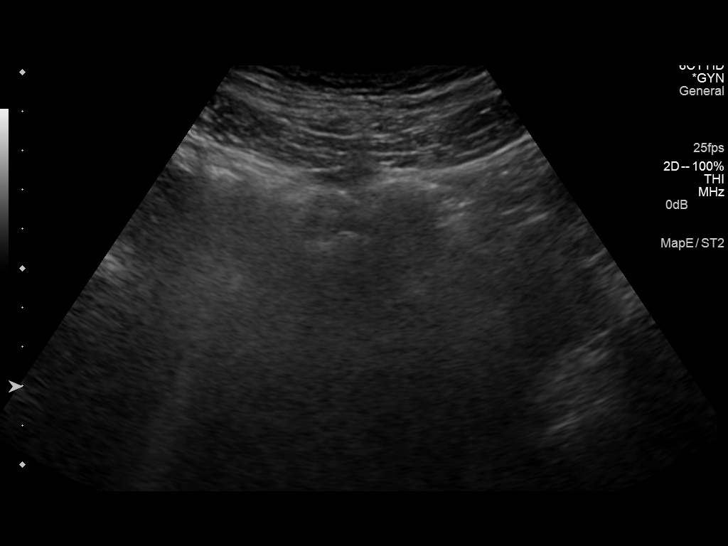
[im 15/60]
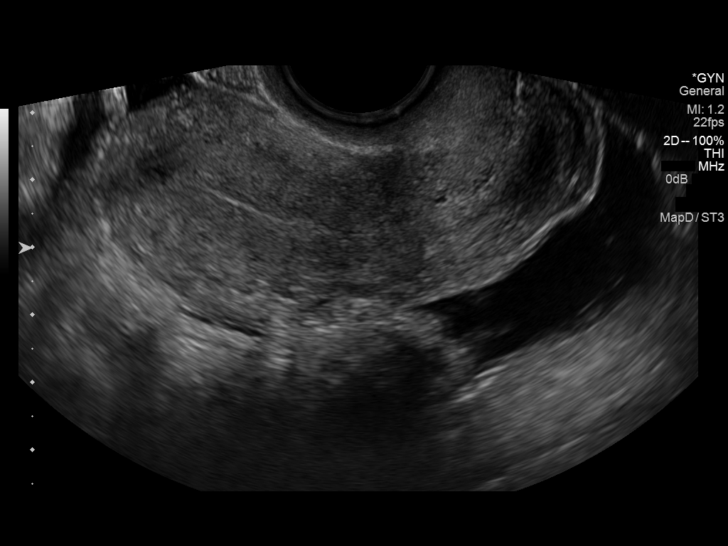
[im 20/60]
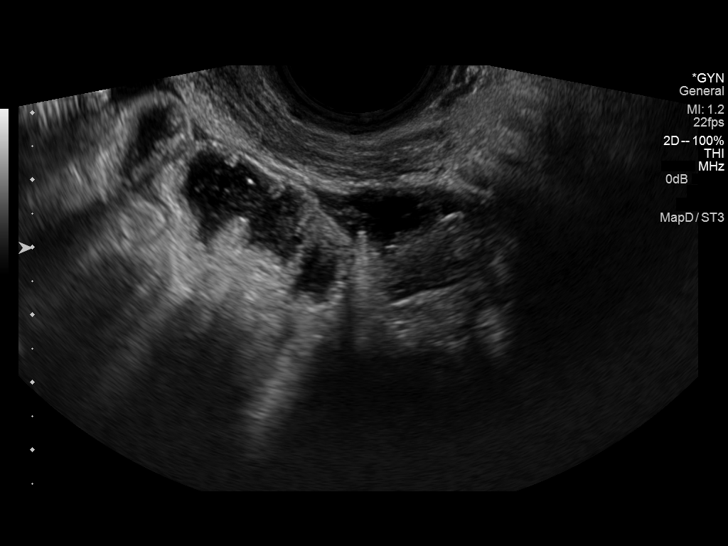
[im 23/60]
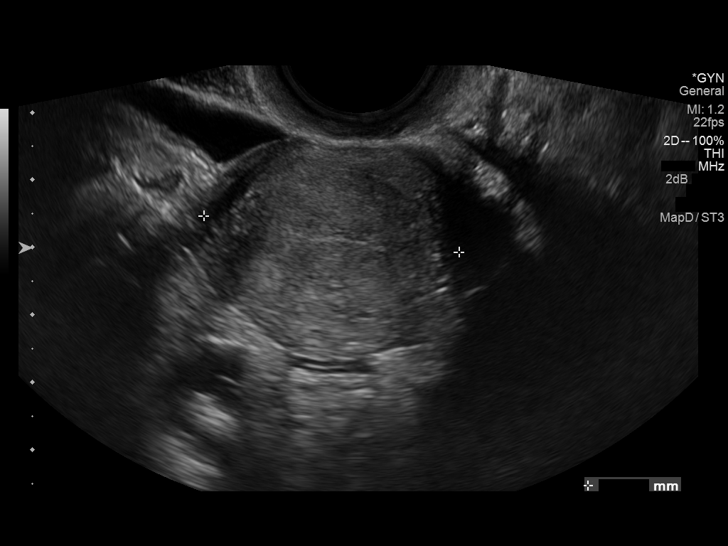
[im 28/60]
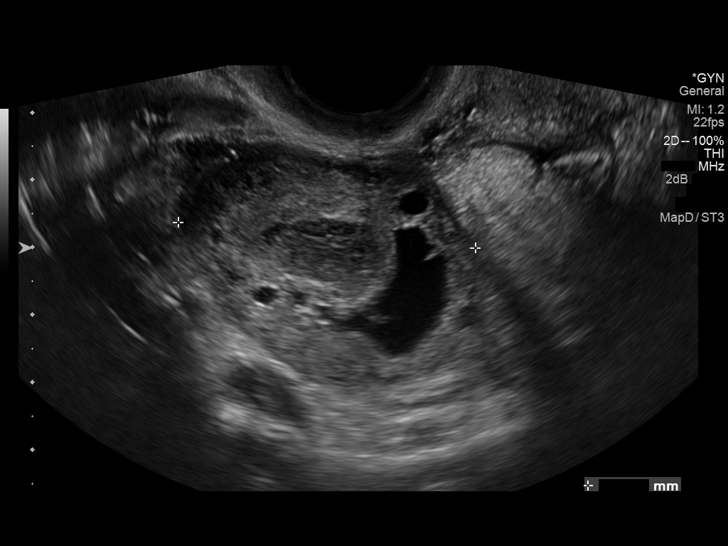
[im 32/60]
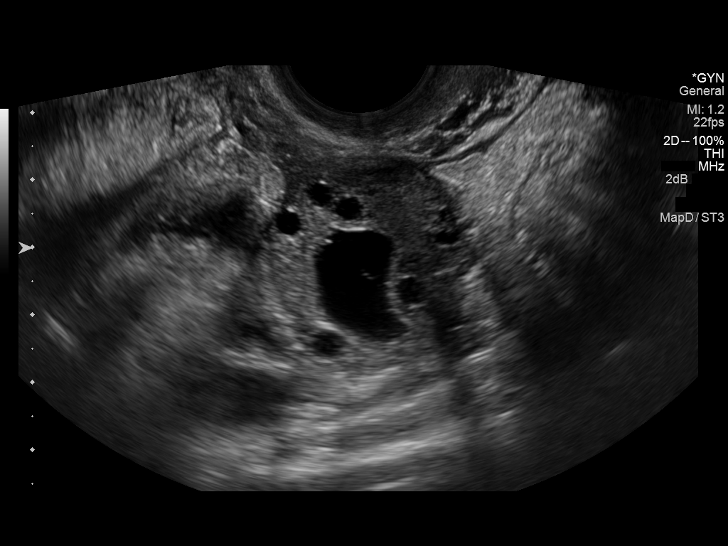
[im 37/60]
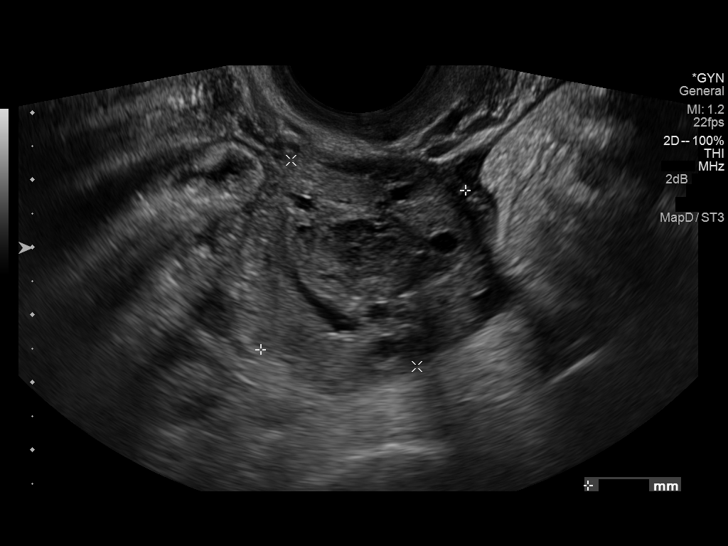
[im 40/60]
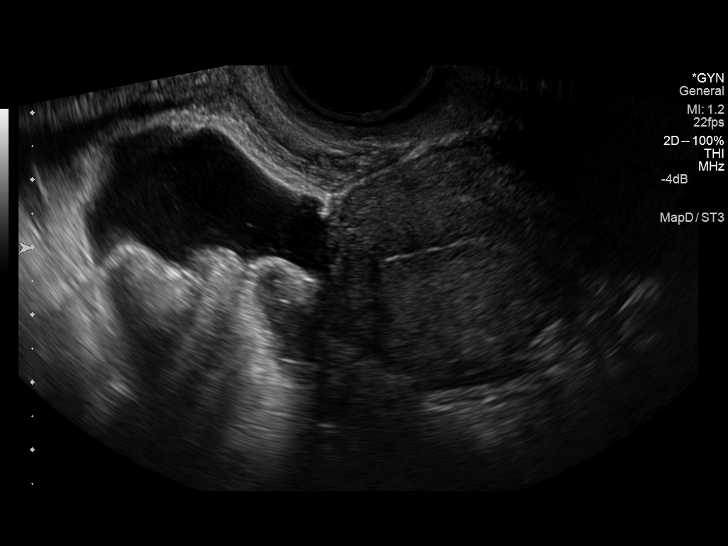
[im 45/60]
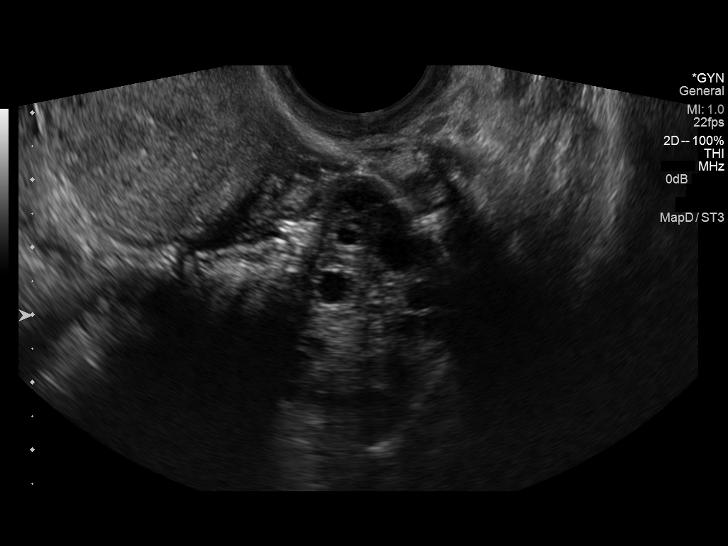
[im 50/60]
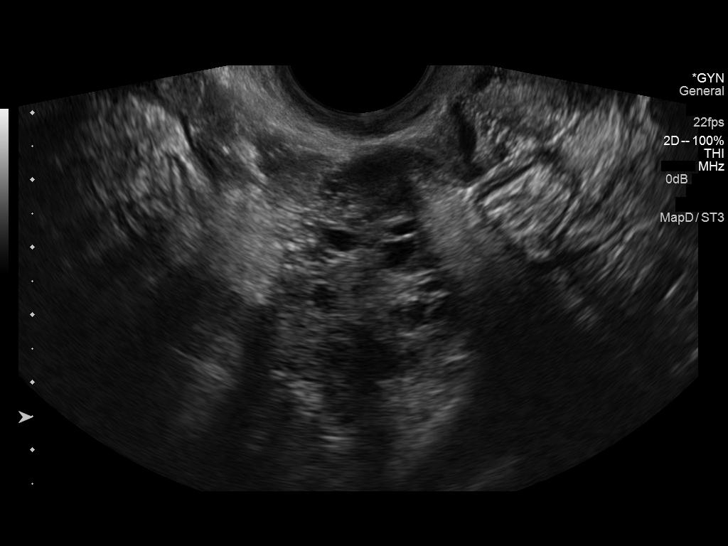
[im 55/60]
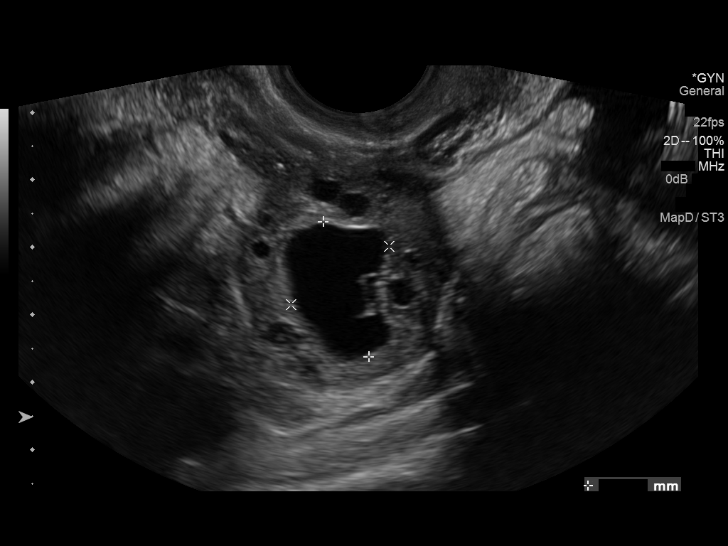
[im 60/60]
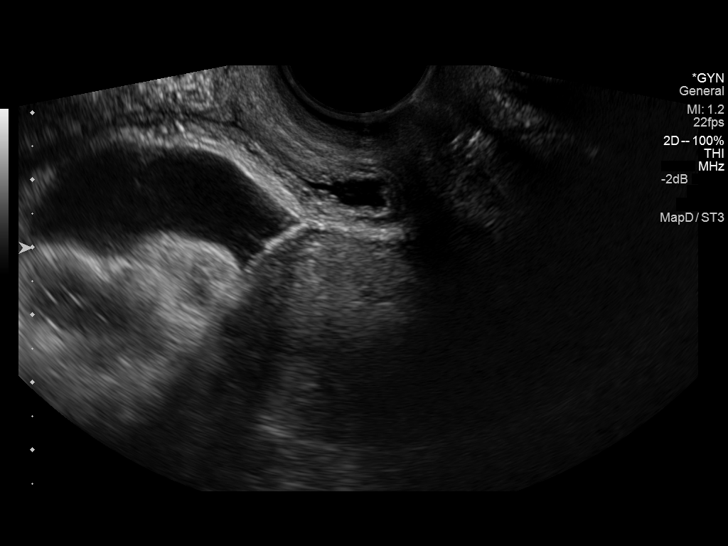

[14 of 25 positions shown; findings below may reference images not displayed]

FINDINGS: Uterus

Measurements: 7.4 x 3.3 x 3.8 cm. No fibroids or other mass
visualized.

Endometrium

Thickness: 9 mm.  No focal abnormality visualized.

Right ovary

Measurements: 3.8 x 3.6 x 4.4 cm. There is a involuted cyst
measuring 1.7 0.2 0.1 cm. No appreciable hydrosalpinx.

Left ovary

Measurements: 4.5 x 3.0 x 2.2 cm. Normal appearance/no adnexal mass.

Other findings

Moderate to large amount of free fluid within the adnexa, superior
to the uterine fundus, and within the cul-de-sac.
IMPRESSION: No adnexal mass or hydrosalpinx.

Moderate to large amount of free fluid within the pelvis.
